# Patient Record
Sex: Female | Born: 1985 | Race: White | Hispanic: No | Marital: Married | Smoking: Never smoker
Health system: Southern US, Community
[De-identification: ages and names within clinical notes are randomized; demographics above are authoritative.]

## PROBLEM LIST (undated history)

## (undated) DIAGNOSIS — T4145XA Adverse effect of unspecified anesthetic, initial encounter: Secondary | ICD-10-CM

## (undated) DIAGNOSIS — R609 Edema, unspecified: Secondary | ICD-10-CM

## (undated) DIAGNOSIS — K219 Gastro-esophageal reflux disease without esophagitis: Secondary | ICD-10-CM

## (undated) DIAGNOSIS — T8859XA Other complications of anesthesia, initial encounter: Secondary | ICD-10-CM

## (undated) HISTORY — PX: SINUS EXPLORATION: SHX5214

## (undated) HISTORY — PX: TUBAL LIGATION: SHX77

## (undated) HISTORY — PX: APPENDECTOMY: SHX54

---

## 2004-02-22 ENCOUNTER — Emergency Department: Payer: Self-pay | Admitting: Emergency Medicine

## 2006-05-27 ENCOUNTER — Other Ambulatory Visit: Payer: Self-pay

## 2006-05-27 ENCOUNTER — Emergency Department: Payer: Self-pay | Admitting: Internal Medicine

## 2006-05-30 ENCOUNTER — Ambulatory Visit: Payer: Self-pay | Admitting: Internal Medicine

## 2006-08-10 ENCOUNTER — Encounter: Payer: Self-pay | Admitting: Maternal & Fetal Medicine

## 2006-09-07 ENCOUNTER — Encounter: Payer: Self-pay | Admitting: Maternal & Fetal Medicine

## 2006-11-07 ENCOUNTER — Other Ambulatory Visit: Payer: Self-pay

## 2006-11-07 ENCOUNTER — Emergency Department: Payer: Self-pay

## 2006-11-27 ENCOUNTER — Observation Stay: Payer: Self-pay

## 2007-01-13 ENCOUNTER — Observation Stay: Payer: Self-pay | Admitting: Obstetrics and Gynecology

## 2007-01-25 ENCOUNTER — Encounter: Payer: Self-pay | Admitting: Maternal & Fetal Medicine

## 2007-01-25 ENCOUNTER — Observation Stay: Payer: Self-pay | Admitting: Obstetrics and Gynecology

## 2007-02-03 ENCOUNTER — Observation Stay: Payer: Self-pay | Admitting: Unknown Physician Specialty

## 2007-02-04 ENCOUNTER — Inpatient Hospital Stay: Payer: Self-pay | Admitting: Unknown Physician Specialty

## 2007-04-02 ENCOUNTER — Ambulatory Visit: Payer: Self-pay | Admitting: Family Medicine

## 2007-04-26 ENCOUNTER — Ambulatory Visit: Payer: Self-pay | Admitting: Internal Medicine

## 2007-04-27 ENCOUNTER — Ambulatory Visit: Payer: Self-pay | Admitting: Family Medicine

## 2009-01-19 IMAGING — CT CT HEAD WITHOUT CONTRAST
2 series · 16 of 30 positions shown, 20 images · non-contrast
Comparison: none

REASON FOR EXAM: headache
COMMENTS:

[Series 2: without · axial · non-contrast · 0.39mm/px · z∈[+356,+476]mm · 13 of 28 slices shown, 17 images]
[im 2/28  brain]
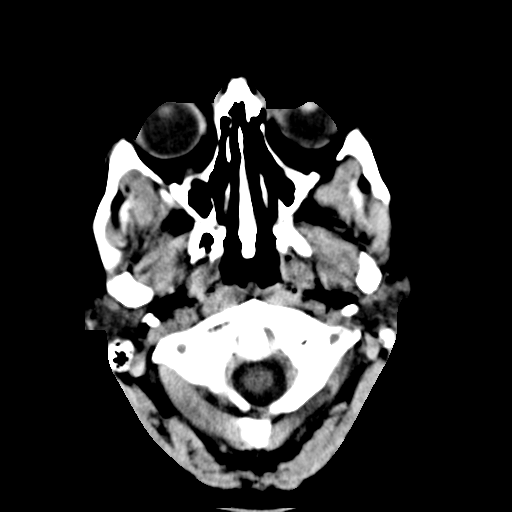
[im 2/28  bone]
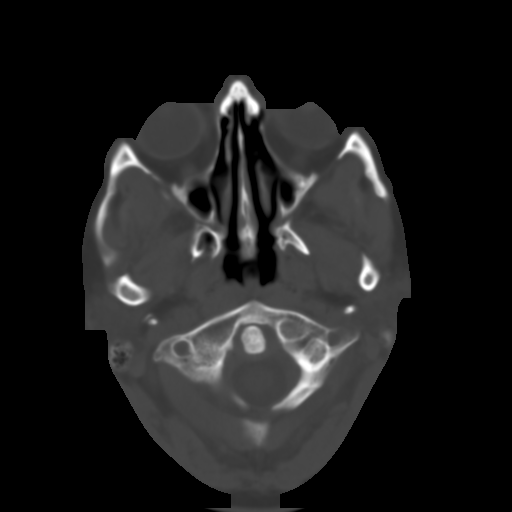
[im 4/28  brain]
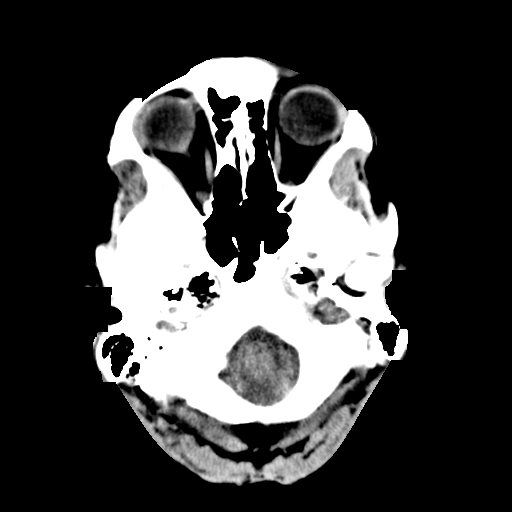
[im 6/28  brain]
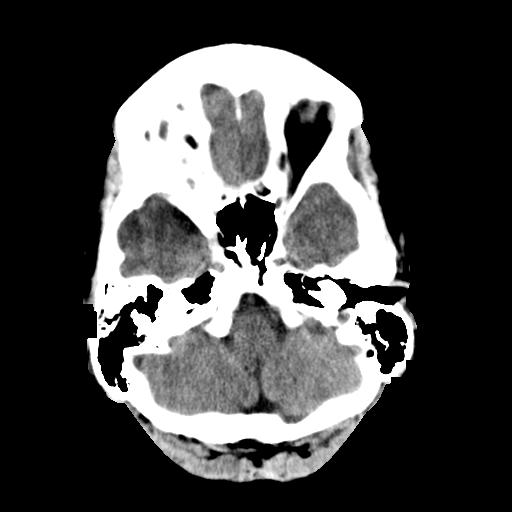
[im 8/28  brain]
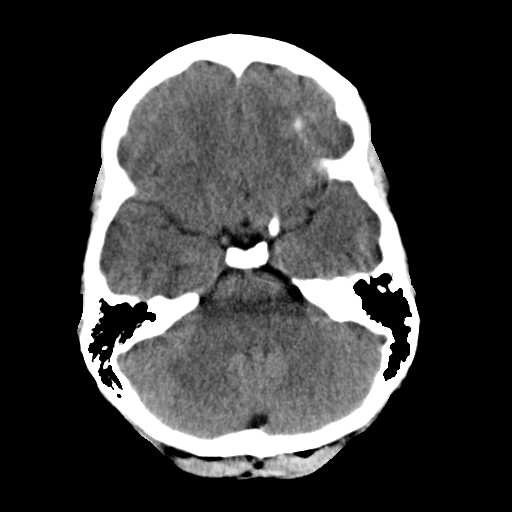
[im 10/28  brain]
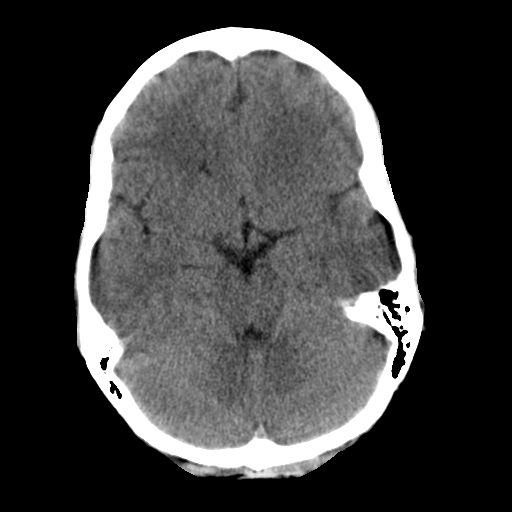
[im 10/28  bone]
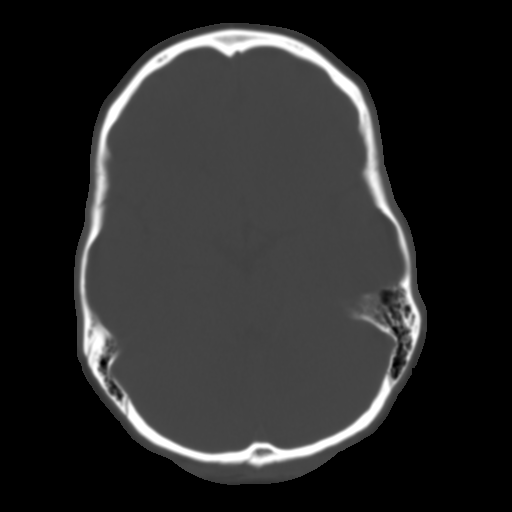
[im 12/28  brain]
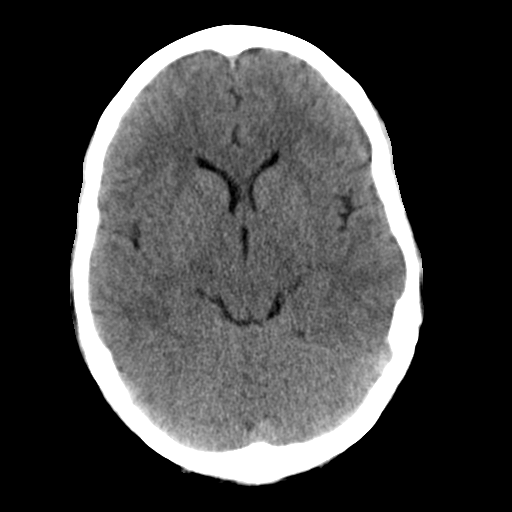
[im 14/28  brain]
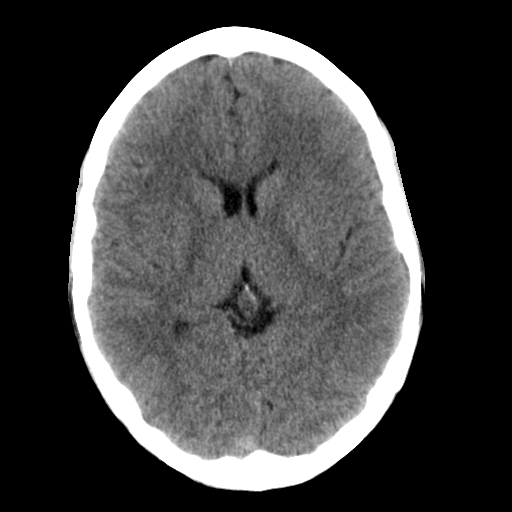
[im 16/28  brain]
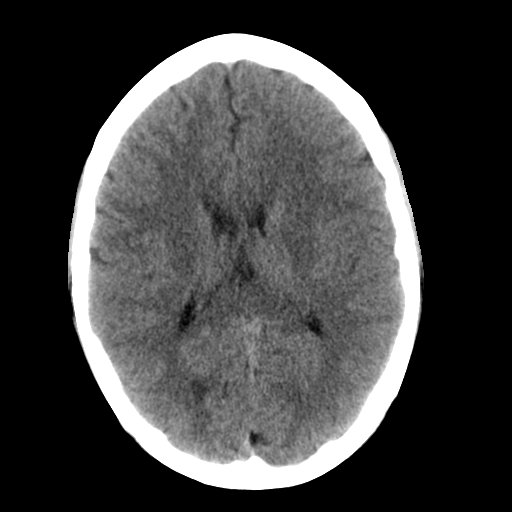
[im 18/28  brain]
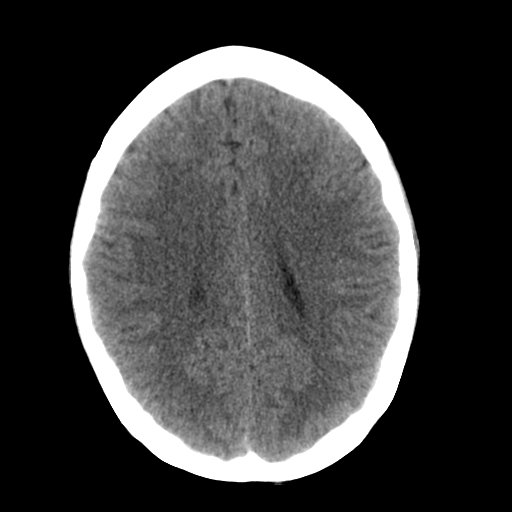
[im 18/28  bone]
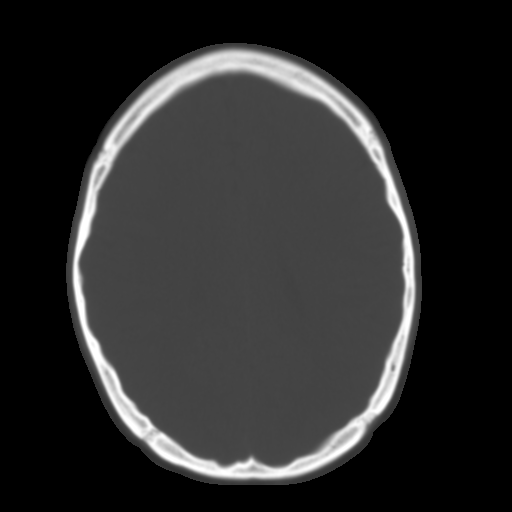
[im 20/28  brain]
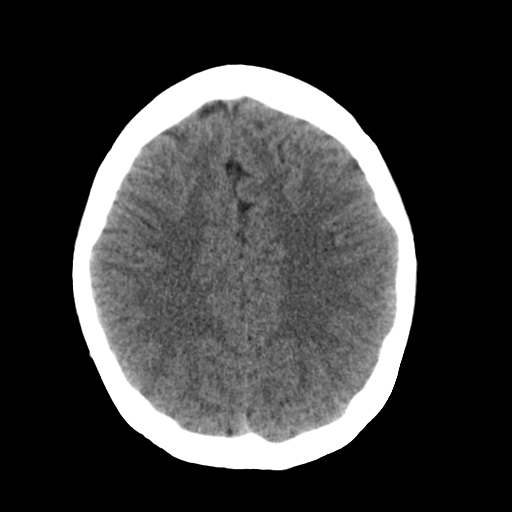
[im 22/28  brain]
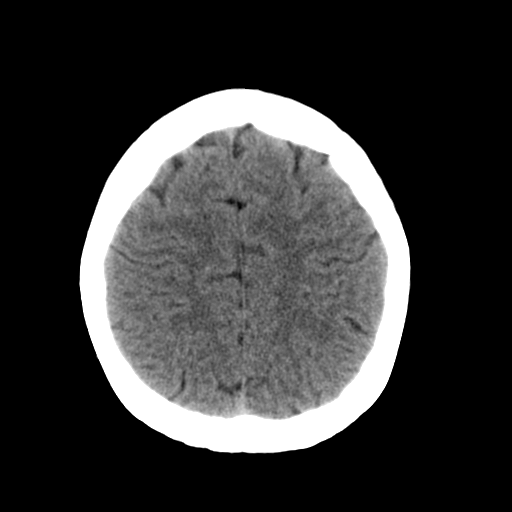
[im 24/28  brain]
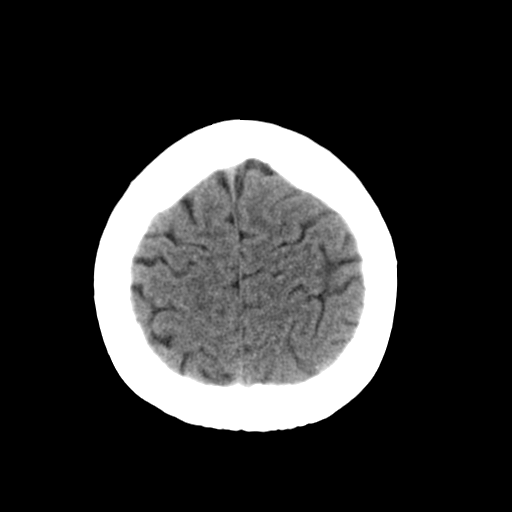
[im 26/28  brain]
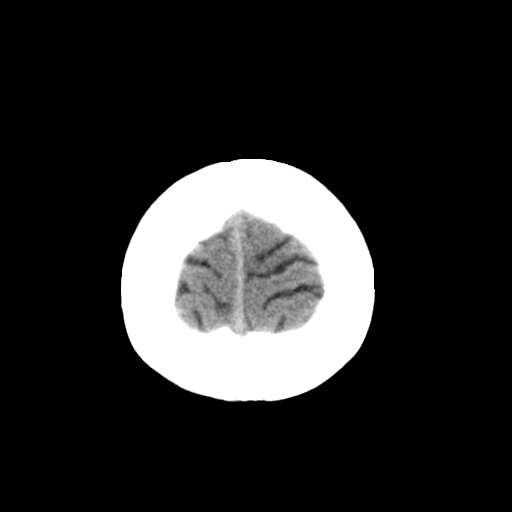
[im 26/28  bone]
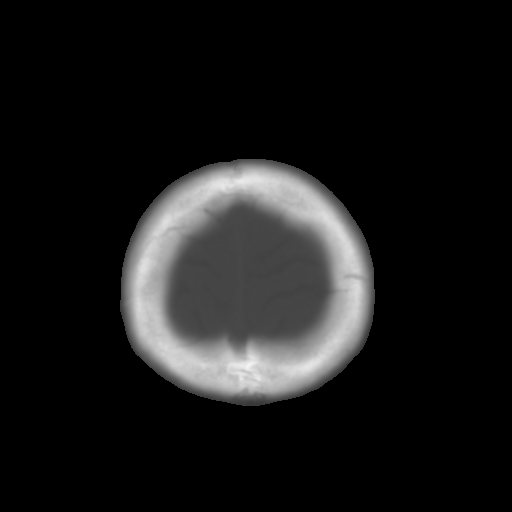

[Series 3: bone · axial · 0.39mm/px · z∈[+356,+396]mm · 3 of 28 slices shown]
[im 2/28  bone]
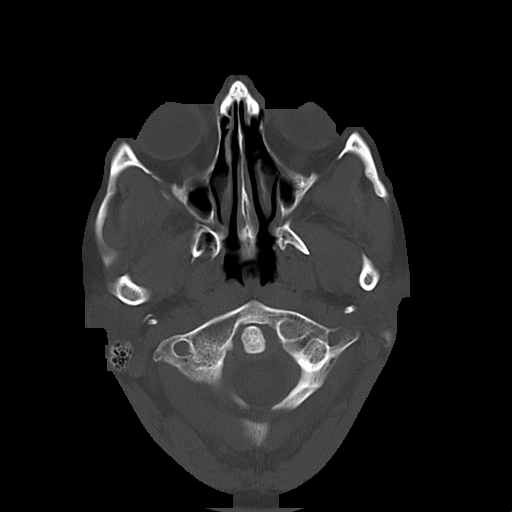
[im 6/28  bone]
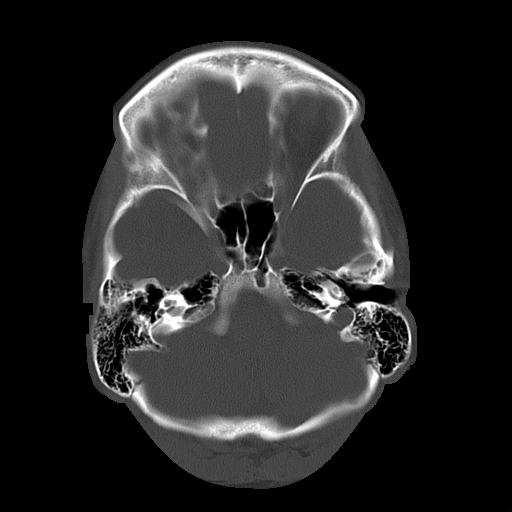
[im 10/28  bone]
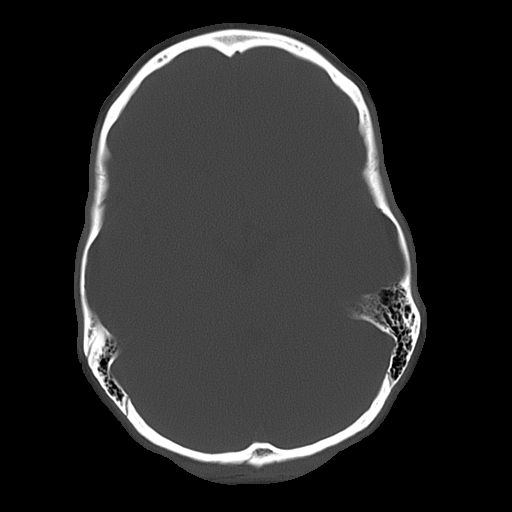

[16 of 30 positions shown; findings below may reference images not displayed]

PROCEDURE:     CT  - CT HEAD WITHOUT CONTRAST  - May 27, 2006  [DATE]

RESULT:     The ventricles are normal in size and position. There is no
intracranial hemorrhage, mass, or mass-effect. At bone window settings I do
not see evidence of an acute skull fracture. The observed portions of the
paranasal sinuses are clear.
IMPRESSION: I see no acute intracranial abnormality.

The findings were  called to the [HOSPITAL] the conclusion of
the study.

## 2009-05-13 ENCOUNTER — Other Ambulatory Visit: Payer: Self-pay | Admitting: Physician Assistant

## 2009-12-01 ENCOUNTER — Ambulatory Visit: Payer: Self-pay | Admitting: Unknown Physician Specialty

## 2010-05-06 ENCOUNTER — Emergency Department: Payer: Self-pay | Admitting: Emergency Medicine

## 2010-09-09 ENCOUNTER — Ambulatory Visit: Payer: Self-pay | Admitting: Obstetrics & Gynecology

## 2010-09-10 ENCOUNTER — Inpatient Hospital Stay: Payer: Self-pay | Admitting: Obstetrics & Gynecology

## 2011-06-22 ENCOUNTER — Ambulatory Visit: Payer: Self-pay | Admitting: Family Medicine

## 2011-08-31 ENCOUNTER — Ambulatory Visit: Payer: Self-pay | Admitting: Family Medicine

## 2011-09-02 DIAGNOSIS — R609 Edema, unspecified: Secondary | ICD-10-CM | POA: Insufficient documentation

## 2012-06-18 ENCOUNTER — Ambulatory Visit: Payer: Self-pay

## 2013-02-01 ENCOUNTER — Ambulatory Visit: Payer: Self-pay | Admitting: Physician Assistant

## 2013-02-01 LAB — RAPID STREP-A WITH REFLX: MICRO TEXT REPORT: NEGATIVE

## 2013-02-04 LAB — BETA STREP CULTURE(ARMC)

## 2013-02-24 ENCOUNTER — Ambulatory Visit: Payer: Self-pay | Admitting: Physician Assistant

## 2013-05-23 ENCOUNTER — Ambulatory Visit: Payer: Self-pay | Admitting: Family Medicine

## 2015-01-20 ENCOUNTER — Ambulatory Visit
Admission: EM | Admit: 2015-01-20 | Discharge: 2015-01-20 | Disposition: A | Discharge status: Home / Self Care | Payer: Managed Care, Other (non HMO) | Attending: Family Medicine | Admitting: Family Medicine

## 2015-01-20 ENCOUNTER — Ambulatory Visit (INDEPENDENT_AMBULATORY_CARE_PROVIDER_SITE_OTHER): Payer: Managed Care, Other (non HMO)

## 2015-01-20 DIAGNOSIS — L309 Dermatitis, unspecified: Secondary | ICD-10-CM

## 2015-01-20 DIAGNOSIS — M79641 Pain in right hand: Secondary | ICD-10-CM

## 2015-01-20 HISTORY — DX: Gilbert syndrome: E80.4

## 2015-01-20 HISTORY — DX: Edema, unspecified: R60.9

## 2015-01-20 MED ORDER — TRIAMCINOLONE 0.1 % CREAM:EUCERIN CREAM 1:1: 1.0000 "application " | TOPICAL_CREAM | Freq: Two times a day (BID) | CUTANEOUS | Status: AC | PRN

## 2015-01-20 MED ORDER — MELOXICAM 15 MG PO TABS: 15.0000 mg | ORAL_TABLET | Freq: Every day | ORAL | Status: DC | PRN

## 2015-01-20 MED ORDER — TRAMADOL HCL 50 MG PO TABS: 50.0000 mg | ORAL_TABLET | Freq: Three times a day (TID) | ORAL | Status: DC | PRN

## 2015-01-20 NOTE — ED Provider Notes (Signed)
[facility]  ____________________________________________  Time seen: Approximately 10:29 AM  I have reviewed the triage vital signs and the nursing notes.   HISTORY Chief Complaint Hand Pain  HPI Kristina Cross is a 30 y.o. female presents with a complaint of right hand pain. Patient reports right hand pain 2 days. Patient reports that Sunday she noticed some intermittent right hand pain that radiated from wrist down to middle finger. Patient reports had some tingling sensation in middle finger that comes and goes. States pain is mild at 3 out of 10 and aching. Denies constant pain. Patient reports the pain comes and goes. Patient states that pain tends to be worse with frequent movement of hands and wrists. Patient reports that she does use hands and wrist frequently. Patient reports that she is right-hand dominant. Denies known fall or injury. Patient does report that she was playing in the snow last week with children and states unsure if she had an injury then. Denies other pain to right hand or extremity. Denies pain radiation upwards. Denies numbness. Denies exacerbation by cold temperatures. Denies coloration changes. Denies loss of sensation. Denies pale coloration. Denies hands being sensitive to cold. Denies changes in exposures.   Patient also reports that during the winter both hands become really drive from washing hands frequently. 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 Patient states that hands get better with soaking in lotions but reports this is a long process every winter when her hands are the stripe. Patient reports both hands intermittently have a reddish coloration and some cracking in skin. Patient sat states that her hands feel very dry. Denies left hand pain. Denies left hand complaints otherwise. Denies changes in range of motion to bilateral hands. Denies week hand grips bilaterally. Denies fall or injury.  LMP: 2  weeks ago, denies chance of pregnancy.    Past Medical History  Diagnosis Date  . Fluid retention   . Sullivan Lone syndrome     There are no active problems to display for this patient.   Past Surgical History  Procedure Laterality Date  . Appendectomy    . Sinus exploration    . Cesarean section      x2  . Tubal ligation      Current Outpatient Rx  Name  Route  Sig  Dispense  Refill  . hydrochlorothiazide (HYDRODIURIL) 25 MG tablet   Oral   Take 25 mg by mouth daily.           Allergies Review of patient's allergies indicates no known allergies.  No family history on file.  Social History Social History  Substance Use Topics  . Smoking status: Never Smoker   . Smokeless tobacco: None  . Alcohol Use: Yes    Review of Systems Constitutional: No fever/chills Eyes: No visual changes. ENT: No sore throat. Cardiovascular: Denies chest pain. Respiratory: Denies shortness of breath. Gastrointestinal: No abdominal pain.  No nausea, no vomiting.  No diarrhea.  No constipation. Genitourinary: Negative for dysuria. Musculoskeletal: Negative for back pain. Right hand pain intermittent.  Skin: bilateral hands dryness.  Neurological: Negative for headaches, focal weakness or numbness.  10-point ROS otherwise negative.  ____________________________________________   PHYSICAL EXAM:  VITAL SIGNS: ED Triage Vitals  Enc Vitals Group     BP 01/20/15 0951 123/92 mmHg     Pulse Rate 01/20/15 0951 87     Resp 01/20/15 0951 16     Temp 01/20/15 0951 98 F (  36.7 C)     Temp Source 01/20/15 0951 Oral     SpO2 01/20/15 0951 99 %     Weight 01/20/15 0951 132 lb (59.875 kg)     Height 01/20/15 0951  (1.575 m)     Head Cir --      Peak Flow --      Pain Score 01/20/15 0955 5     Pain Loc --      Pain Edu? --      Excl. in GC? --     Constitutional: Alert and oriented. Well appearing and in no acute distress. Eyes: Conjunctivae are normal. PERRL. EOMI. Head:  Atraumatic.  Nose: No congestion/rhinnorhea.  Mouth/Throat: Mucous membranes are moist.  Oropharynx non-erythematous. Neck: No stridor.  No cervical spine tenderness to palpation. Hematological/Lymphatic/Immunilogical: No cervical lymphadenopathy. Cardiovascular: Normal rate, regular rhythm. Grossly normal heart sounds.  Good peripheral circulation. Respiratory: Normal respiratory effort.  No retractions. Lungs CTAB. Gastrointestinal: Soft and nontender.  Musculoskeletal: No lower or upper extremity tenderness nor edema.  No cervical, thoracic or lumbar TTP. Bilateral hand grips equal and strong. Bilateral distal radial pulses equal and easily palpable. Bilateral hands with cap refill less than 2 seconds to all distal fingers. Positive Tinel's and Phalen's signs and the right hand. Right hand nontender to palpation. Full ROM to right hand, no pain or difficulty with thumb touch to each finger. No sensation, motor or tendon deficit to right hand. Normal coloration and warmth. Scattered dry flaky skin noted with minimal erythema at dry skin areas to bilateral hands, No erythema otherwise. No induration, drainage or fluctuance. Neurologic:  Normal speech and language. No gross focal neurologic deficits are appreciated. No gait instability. Skin:  Skin is warm, dry and intact. No rash noted. Psychiatric: Mood and affect are normal. Speech and behavior are normal.  ____________________________________________   LABS (all labs ordered are listed, but only abnormal results are displayed)  Labs Reviewed - No data to display  RADIOLOGY  EXAM: RIGHT HAND - COMPLETE 3+ VIEW  COMPARISON: None.  FINDINGS: There is no evidence of fracture or dislocation. There is no evidence of arthropathy or other focal bone abnormality. Soft tissues are unremarkable.  IMPRESSION: Negative.   Electronically Signed By: Marnee Spring M.D. On: 01/20/2015 10:39  I, Renford Dills, personally viewed and  evaluated these images (plain radiographs) as part of my medical decision making, as well as reviewing the written report by the radiologist.  ____________________________________________   PROCEDURES  Procedure(s) performed: Right velcro cock up splint applied by RN, neurovascular intact post application.  ________________   INITIAL IMPRESSION / ASSESSMENT AND PLAN / ED COURSE  Pertinent labs & imaging results that were available during my care of the patient were reviewed by me and considered in my medical decision making (see chart for details).   Very well-appearing patient. No acute distress. Presents for the complaints of right hand pain intermittent 2 days.  Denies known fall or trauma. Neurovascular intact.Cap refill <2seconds to all distal fingers bilaterally.  Positive Tinel's and Phalen's to right hand.No bony tenderness. Bilateral hand grips equal. Normal warmth and coloration. Right-hand-dominant with frequent use of right hand. 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 Patient reports that this is chronic for her hands during the winter. Right hand discomfort suspect carpal tunnel syndrome. Counseled close monitoring. Discussed  resting, splint applied, when necessary Mobic and when necessary tramadol.   Also, gave patient a prescription for triamcinolone Eucerin compound to use for bilateral hands as needed including counseling regarding hydration and avoidance of trigger. Patient to follow-up with primary care physician this week. Information for orthopedic Dr. Joice Lofts also given to follow-up for any continued hand pain.  Discussed follow up with Primary care physician this week. Discussed follow up and return  urgent care or ER parameters including increased pain, numbness, tingling sensation, change in coloration,  difficulty in movement, no resolution or any worsening concerns. Patient verbalized understanding and agreed to plan.   ____________________________________________   FINAL CLINICAL IMPRESSION(S) / ED DIAGNOSES  Final diagnoses:  Right hand pain  Irritant dermatitis bilateral hands.       Renford Dills, NP 01/20/15 1228

## 2015-01-20 NOTE — ED Notes (Signed)
Pt c/o right hand pain with swelling since Sunday.. Pt has noted redness to BL hands, states that is normal due to having to wash her hands a lot with her job.Marland Kitchen

## 2015-01-20 NOTE — Discharge Instructions (Signed)
Use medication as prescribed. Avoid irritating triggers to hands, keep hydrated. Drink plenty of fluids. Wear splint as needed for pain, rest wrist and hand.   Follow up with your primary care physician this week as needed. Follow up with orthopedic this week as needed for continued pain.   Return to Urgent care or go to ER for increased pain, redness, swelling, numbness, loss of sensation, weakness, new or worsening concerns.   Hand Dermatitis Hand dermatitis (dyshidrotic eczema) is a skin condition in which small, itchy, raised dots or fluid-filled blisters form over the palms of the hands. Outbreaks of hand dermatitis can last 3 to 4 weeks. CAUSES  The cause of hand dermatitis is unknown. However, it occurs most often in patients with a history of allergies such as:  Hay fever.  Allergic asthma.  Allergies to latex. Chemical exposure, injuries, and environmental irritants can make hand dermatitis worse. Washing your hands too frequently can remove natural oils, which can dry out the skin and contribute to outbreaks of hand dermatitis. SYMPTOMS  The most common symptom of hand dermatitis is intense itching. Cracks or grooves (fissures) on the fingers can also develop. Affected areas can be painful, especially areas where large blisters have formed. DIAGNOSIS Your caregiver can usually tell what the problem is by doing a physical exam. PREVENTION  Avoid excessive hand washing.  Avoid the use of harsh chemicals.  Wear protective gloves when handling products that can irritate your skin. TREATMENT  Steroid creams and ointments, such as over-the-counter 1% hydrocortisone cream, can reduce inflammation and improve moisture retention. These should be applied at least 2 to 4 times per day. Your caregiver may ask you to use a stronger prescription steroid cream to help speed the healing of blistered and cracked skin. In severe cases, oral steroid medicine may be needed. If you have an  infection, antibiotics may be needed. Your caregiver may also prescribe antihistamines. These medicines help reduce itching. HOME CARE INSTRUCTIONS  Only take over-the-counter or prescription medicines as directed by your caregiver.  You may use wet or cold compresses. This can help:  Alleviate itching.  Increase the effectiveness of topical creams.  Minimize blisters. SEEK MEDICAL CARE IF:  The rash is not better after 1 week of treatment.  Signs of infection develop, such as redness, tenderness, or yellowish-white fluid (pus).  The rash is spreading.   This information is not intended to replace advice given to you by your health care provider. Make sure you discuss any questions you have with your health care provider.   Document Released: 12/20/2004 Document Revised: 03/14/2011 Document Reviewed: 07/04/2014 Elsevier Interactive Patient Education [employer]

## 2015-03-25 ENCOUNTER — Inpatient Hospital Stay: Admission: RE | Admit: 2015-03-25 | Payer: Managed Care, Other (non HMO) | Source: Ambulatory Visit

## 2015-03-25 ENCOUNTER — Encounter: Payer: Self-pay | Admitting: *Deleted

## 2015-03-25 NOTE — Patient Instructions (Signed)
  Your procedure is scheduled on: 03-27-15  Report to [facility] 2ND FLOOR To find out your arrival time please call [phone] between 1PM - 3PM on 03-26-15  Remember: Instructions that are not followed completely may result in serious medical risk, up to and including death, or upon the discretion of your surgeon and anesthesiologist your surgery may need to be rescheduled.    _X___ 1. Do not eat food or drink liquids after midnight. No gum chewing or hard candies.     _X___ 2. No Alcohol for 24 hours before or after surgery.   ____ 3. Bring all medications with you on the day of surgery if instructed.    ____ 4. Notify your doctor if there is any change in your medical condition     (cold, fever, infections).     Do not wear jewelry, make-up, hairpins, clips or nail polish.  Do not wear lotions, powders, or perfumes. You may wear deodorant.  Do not shave 48 hours prior to surgery. Men may shave face and neck.  Do not bring valuables to the hospital.    [facility] is not responsible for any belongings or valuables.               Contacts, dentures or bridgework may not be worn into surgery.  Leave your suitcase in the car. After surgery it may be brought to your room.  For patients admitted to the hospital, discharge time is determined by your treatment team.   Patients discharged the day of surgery will not be allowed to drive home.   Please read over the following fact sheets that you were given:     ____ Take these medicines the morning of surgery with A SIP OF WATER:    1. NONE  2.   3.   4.  5.  6.  ____ Fleet Enema (as directed)   ____ Use CHG Soap as directed  ____ Use inhalers on the day of surgery  ____ Stop metformin 2 days prior to surgery    ____ Take 1/2 of usual insulin dose the night before surgery and none on the morning of surgery.   ____ Stop Coumadin/Plavix/aspirin-N/A  _X___ Stop Anti-inflammatories-STOP IBUPROFEN NOW-NO  NSAIDS OR ASA PRODUCTS-TYLENOL OK TO TAKE   ____ Stop supplements until after surgery.    ____ Bring C-Pap to the hospital.

## 2015-03-27 ENCOUNTER — Ambulatory Visit
Admission: RE | Admit: 2015-03-27 | Discharge: 2015-03-27 | Disposition: A | Discharge status: Home / Self Care | Payer: Managed Care, Other (non HMO) | Source: Ambulatory Visit | Attending: Obstetrics & Gynecology | Admitting: Obstetrics & Gynecology

## 2015-03-27 ENCOUNTER — Encounter: Payer: Self-pay | Admitting: *Deleted

## 2015-03-27 ENCOUNTER — Ambulatory Visit: Payer: Managed Care, Other (non HMO) | Admitting: Anesthesiology

## 2015-03-27 ENCOUNTER — Encounter
Admission: RE | Disposition: A | Discharge status: Home / Self Care | Payer: Self-pay | Source: Ambulatory Visit | Attending: Obstetrics & Gynecology

## 2015-03-27 DIAGNOSIS — Z79899 Other long term (current) drug therapy: Secondary | ICD-10-CM | POA: Diagnosis not present

## 2015-03-27 DIAGNOSIS — Z803 Family history of malignant neoplasm of breast: Secondary | ICD-10-CM | POA: Diagnosis not present

## 2015-03-27 DIAGNOSIS — N83201 Unspecified ovarian cyst, right side: Secondary | ICD-10-CM | POA: Diagnosis not present

## 2015-03-27 DIAGNOSIS — Z842 Family history of other diseases of the genitourinary system: Secondary | ICD-10-CM | POA: Insufficient documentation

## 2015-03-27 DIAGNOSIS — N736 Female pelvic peritoneal adhesions (postinfective): Secondary | ICD-10-CM | POA: Diagnosis not present

## 2015-03-27 DIAGNOSIS — Z808 Family history of malignant neoplasm of other organs or systems: Secondary | ICD-10-CM | POA: Diagnosis not present

## 2015-03-27 DIAGNOSIS — R1031 Right lower quadrant pain: Secondary | ICD-10-CM | POA: Diagnosis not present

## 2015-03-27 DIAGNOSIS — Z91048 Other nonmedicinal substance allergy status: Secondary | ICD-10-CM | POA: Insufficient documentation

## 2015-03-27 DIAGNOSIS — Z8349 Family history of other endocrine, nutritional and metabolic diseases: Secondary | ICD-10-CM | POA: Insufficient documentation

## 2015-03-27 DIAGNOSIS — I10 Essential (primary) hypertension: Secondary | ICD-10-CM | POA: Diagnosis not present

## 2015-03-27 DIAGNOSIS — R102 Pelvic and perineal pain: Secondary | ICD-10-CM | POA: Diagnosis present

## 2015-03-27 HISTORY — DX: Gastro-esophageal reflux disease without esophagitis: K21.9

## 2015-03-27 HISTORY — DX: Other complications of anesthesia, initial encounter: T88.59XA

## 2015-03-27 HISTORY — PX: LAPAROSCOPIC UNILATERAL SALPINGECTOMY: SHX5934

## 2015-03-27 HISTORY — PX: LAPAROSCOPY: SHX197

## 2015-03-27 HISTORY — PX: LAPAROSCOPIC OVARIAN CYSTECTOMY: SHX6248

## 2015-03-27 HISTORY — DX: Adverse effect of unspecified anesthetic, initial encounter: T41.45XA

## 2015-03-27 LAB — TYPE AND SCREEN
ABO/RH(D): O POS
ANTIBODY SCREEN: NEGATIVE

## 2015-03-27 LAB — ABO/RH: ABO/RH(D): O POS

## 2015-03-27 LAB — POCT PREGNANCY, URINE: PREG TEST UR: NEGATIVE

## 2015-03-27 SURGERY — LAPAROSCOPY OPERATIVE
Anesthesia: General | Site: Abdomen | Laterality: Right | Wound class: Clean

## 2015-03-27 MED ORDER — FENTANYL CITRATE (PF) 100 MCG/2ML IJ SOLN
INTRAMUSCULAR | Status: AC
  Administered 2015-03-27: 25 ug via INTRAVENOUS
  Filled 2015-03-27: qty 2

## 2015-03-27 MED ORDER — PROPOFOL 10 MG/ML IV BOLUS
INTRAVENOUS | Status: DC | PRN
  Administered 2015-03-27: 100 mg via INTRAVENOUS

## 2015-03-27 MED ORDER — FAMOTIDINE 20 MG PO TABS
ORAL_TABLET | ORAL | Status: AC
  Administered 2015-03-27: 20 mg via ORAL
  Filled 2015-03-27: qty 1

## 2015-03-27 MED ORDER — FENTANYL CITRATE (PF) 100 MCG/2ML IJ SOLN
25.0000 ug | INTRAMUSCULAR | Status: DC | PRN
  Administered 2015-03-27 (×4): 25 ug via INTRAVENOUS

## 2015-03-27 MED ORDER — BUPIVACAINE HCL (PF) 0.5 % IJ SOLN
INTRAMUSCULAR | Status: DC | PRN
  Administered 2015-03-27: 8 mg

## 2015-03-27 MED ORDER — DEXMEDETOMIDINE HCL 200 MCG/2ML IV SOLN
INTRAVENOUS | Status: DC | PRN
  Administered 2015-03-27: 12 ug via INTRAVENOUS

## 2015-03-27 MED ORDER — ACETAMINOPHEN 325 MG PO TABS: 650.0000 mg | ORAL_TABLET | ORAL | Status: DC | PRN

## 2015-03-27 MED ORDER — GLYCOPYRROLATE 0.2 MG/ML IJ SOLN
INTRAMUSCULAR | Status: DC | PRN
  Administered 2015-03-27: 0.6 mg via INTRAVENOUS

## 2015-03-27 MED ORDER — ONDANSETRON HCL 4 MG/2ML IJ SOLN
INTRAMUSCULAR | Status: DC | PRN
  Administered 2015-03-27: 4 mg via INTRAVENOUS

## 2015-03-27 MED ORDER — ONDANSETRON HCL 4 MG/2ML IJ SOLN
4.0000 mg | Freq: Once | INTRAMUSCULAR | Status: AC | PRN
  Administered 2015-03-27: 4 mg via INTRAVENOUS

## 2015-03-27 MED ORDER — FAMOTIDINE 20 MG PO TABS
20.0000 mg | ORAL_TABLET | Freq: Once | ORAL | Status: AC
  Administered 2015-03-27: 20 mg via ORAL

## 2015-03-27 MED ORDER — FENTANYL CITRATE (PF) 100 MCG/2ML IJ SOLN
INTRAMUSCULAR | Status: DC | PRN
  Administered 2015-03-27 (×4): 50 ug via INTRAVENOUS

## 2015-03-27 MED ORDER — KETOROLAC TROMETHAMINE 30 MG/ML IJ SOLN: 30.0000 mg | Freq: Four times a day (QID) | INTRAMUSCULAR | Status: DC

## 2015-03-27 MED ORDER — BUPIVACAINE HCL (PF) 0.5 % IJ SOLN
INTRAMUSCULAR | Status: AC
Start: 2015-03-27 — End: 2015-03-27
  Filled 2015-03-27: qty 30

## 2015-03-27 MED ORDER — PHENYLEPHRINE HCL 10 MG/ML IJ SOLN
INTRAMUSCULAR | Status: DC | PRN
  Administered 2015-03-27 (×2): 50 ug via INTRAVENOUS

## 2015-03-27 MED ORDER — ONDANSETRON HCL 4 MG/2ML IJ SOLN
INTRAMUSCULAR | Status: AC
  Filled 2015-03-27: qty 2

## 2015-03-27 MED ORDER — NEOSTIGMINE METHYLSULFATE 10 MG/10ML IV SOLN
INTRAVENOUS | Status: DC | PRN
  Administered 2015-03-27: 3 mg via INTRAVENOUS

## 2015-03-27 MED ORDER — MORPHINE SULFATE (PF) 2 MG/ML IV SOLN: 1.0000 mg | INTRAVENOUS | Status: DC | PRN

## 2015-03-27 MED ORDER — ACETAMINOPHEN 650 MG RE SUPP: 650.0000 mg | RECTAL | Status: DC | PRN

## 2015-03-27 MED ORDER — LIDOCAINE HCL (CARDIAC) 20 MG/ML IV SOLN
INTRAVENOUS | Status: DC | PRN
  Administered 2015-03-27: 60 mg via INTRAVENOUS

## 2015-03-27 MED ORDER — LACTATED RINGERS IV SOLN
INTRAVENOUS | Status: DC
  Administered 2015-03-27 (×2): via INTRAVENOUS

## 2015-03-27 MED ORDER — OXYCODONE HCL 5 MG PO TABS: 5.0000 mg | ORAL_TABLET | ORAL | Status: DC | PRN

## 2015-03-27 MED ORDER — ROCURONIUM BROMIDE 100 MG/10ML IV SOLN
INTRAVENOUS | Status: DC | PRN
  Administered 2015-03-27: 5 mg via INTRAVENOUS
  Administered 2015-03-27: 30 mg via INTRAVENOUS
  Administered 2015-03-27: 10 mg via INTRAVENOUS

## 2015-03-27 MED ORDER — KETOROLAC TROMETHAMINE 30 MG/ML IJ SOLN
INTRAMUSCULAR | Status: DC | PRN
  Administered 2015-03-27: 30 mg via INTRAVENOUS

## 2015-03-27 MED ORDER — DEXAMETHASONE SODIUM PHOSPHATE 10 MG/ML IJ SOLN
INTRAMUSCULAR | Status: DC | PRN
  Administered 2015-03-27: 10 mg via INTRAVENOUS

## 2015-03-27 MED ORDER — EPHEDRINE SULFATE 50 MG/ML IJ SOLN
INTRAMUSCULAR | Status: DC | PRN
  Administered 2015-03-27: 5 mg via INTRAVENOUS

## 2015-03-27 MED ORDER — MIDAZOLAM HCL 2 MG/2ML IJ SOLN
INTRAMUSCULAR | Status: DC | PRN
  Administered 2015-03-27 (×2): 2 mg via INTRAVENOUS

## 2015-03-27 SURGICAL SUPPLY — 39 items
BLADE SURG SZ11 CARB STEEL (BLADE) ×5 IMPLANT
CANISTER SUCT 1200ML W/VALVE (MISCELLANEOUS) ×5 IMPLANT
CATH ROBINSON RED A/P 16FR (CATHETERS) ×5 IMPLANT
CHLORAPREP W/TINT 26ML (MISCELLANEOUS) ×5 IMPLANT
DRESSING TELFA 4X3 1S ST N-ADH (GAUZE/BANDAGES/DRESSINGS) IMPLANT
DRSG TEGADERM 2-3/8X2-3/4 SM (GAUZE/BANDAGES/DRESSINGS) ×15 IMPLANT
ENDOPOUCH RETRIEVER 10 (MISCELLANEOUS) IMPLANT
GAUZE SPONGE NON-WVN 2X2 STRL (MISCELLANEOUS) IMPLANT
GAUZE STRETCH 2X75IN STRL (MISCELLANEOUS) ×5 IMPLANT
GLOVE BIO SURGEON STRL SZ 6.5 (GLOVE) ×4 IMPLANT
GLOVE BIO SURGEON STRL SZ8 (GLOVE) ×5 IMPLANT
GLOVE BIO SURGEONS STRL SZ 6.5 (GLOVE) ×1
GLOVE INDICATOR 8.0 STRL GRN (GLOVE) ×5 IMPLANT
GOWN STRL REUS W/ TWL LRG LVL3 (GOWN DISPOSABLE) ×3 IMPLANT
GOWN STRL REUS W/ TWL XL LVL3 (GOWN DISPOSABLE) ×3 IMPLANT
GOWN STRL REUS W/TWL LRG LVL3 (GOWN DISPOSABLE) ×2
GOWN STRL REUS W/TWL XL LVL3 (GOWN DISPOSABLE) ×2
IRRIGATION STRYKERFLOW (MISCELLANEOUS) ×3 IMPLANT
IRRIGATOR STRYKERFLOW (MISCELLANEOUS) ×5
IV LACTATED RINGERS 1000ML (IV SOLUTION) ×5 IMPLANT
KIT PINK PAD W/HEAD ARE REST (MISCELLANEOUS) ×5
KIT PINK PAD W/HEAD ARM REST (MISCELLANEOUS) ×3 IMPLANT
LABEL OR SOLS (LABEL) ×5 IMPLANT
LIQUID BAND (GAUZE/BANDAGES/DRESSINGS) ×5 IMPLANT
NEEDLE VERESS 14GA 120MM (NEEDLE) ×5 IMPLANT
NS IRRIG 500ML POUR BTL (IV SOLUTION) ×5 IMPLANT
PACK GYN LAPAROSCOPIC (MISCELLANEOUS) ×5 IMPLANT
PAD PREP 24X41 OB/GYN DISP (PERSONAL CARE ITEMS) ×5 IMPLANT
SCISSORS METZENBAUM CVD 33 (INSTRUMENTS) ×5 IMPLANT
SHEARS HARMONIC ACE PLUS 36CM (ENDOMECHANICALS) ×5 IMPLANT
SLEEVE ENDOPATH XCEL 5M (ENDOMECHANICALS) ×5 IMPLANT
SPONGE VERSALON 2X2 STRL (MISCELLANEOUS)
STRAP SAFETY BODY (MISCELLANEOUS) ×5 IMPLANT
SUT VIC AB 2-0 UR6 27 (SUTURE) IMPLANT
SUT VIC AB 4-0 PS2 18 (SUTURE) IMPLANT
SYRINGE 10CC LL (SYRINGE) ×5 IMPLANT
TROCAR ENDO BLADELESS 11MM (ENDOMECHANICALS) ×5 IMPLANT
TROCAR XCEL NON-BLD 5MMX100MML (ENDOMECHANICALS) ×5 IMPLANT
TUBING INSUFFLATOR HI FLOW (MISCELLANEOUS) ×5 IMPLANT

## 2015-03-27 NOTE — Anesthesia Preprocedure Evaluation (Signed)
Anesthesia Evaluation  Patient identified by MRN, date of birth, ID band Patient awake    Reviewed: Allergy & Precautions, NPO status , Patient's Chart, lab work & pertinent test results  History of Anesthesia Complications (+) history of anesthetic complications  Airway Mallampati: II       Dental  (+) Teeth Intact   Pulmonary neg pulmonary ROS,           Cardiovascular negative cardio ROS       Neuro/Psych negative neurological ROS  negative psych ROS   GI/Hepatic Neg liver ROS, GERD (resolving)  ,Gilbert syndrome    Endo/Other  negative endocrine ROS  Renal/GU negative Renal ROS     Musculoskeletal   Abdominal   Peds  Hematology negative hematology ROS (+)   Anesthesia Other Findings   Reproductive/Obstetrics                             Anesthesia Physical Anesthesia Plan  ASA: II  Anesthesia Plan: General   Post-op Pain Management:    Induction: Intravenous  Airway Management Planned: Oral ETT  Additional Equipment:   Intra-op Plan:   Post-operative Plan:   Informed Consent: I have reviewed the patients History and Physical, chart, labs and discussed the procedure including the risks, benefits and alternatives for the proposed anesthesia with the patient or authorized representative who has indicated his/her understanding and acceptance.     Plan Discussed with:   Anesthesia Plan Comments:         Anesthesia Quick Evaluation

## 2015-03-27 NOTE — H&P (Signed)
History and Physical Interval Note:  03/27/2015 9:54 AM  Vladimir CroftsShannon F Lopp  has presented today for surgery, with the diagnosis of ADNEXAL MASS,RIGHT LOWER QUADRANT PAIN  The various methods of treatment have been discussed with the patient and family. After consideration of risks, benefits and other options for treatment, the patient has consented to  Procedure(s): LAPAROSCOPY OPERATIVE (N/A) EXCISION OF ADNEXAL MASS (Right) as a surgical intervention .  The patient's history has been reviewed, patient examined, no change in status, stable for surgery.  Pt has the following beta blocker history-  Not taking Beta Blocker.  I have reviewed the patient's chart and labs.  Questions were answered to the patient's satisfaction.    Letitia Libraobert Paul Chantz Montefusco

## 2015-03-27 NOTE — Op Note (Signed)
  Operative Note   03/27/2015  PRE-OP DIAGNOSIS: Right Ovarian Cyst, Right Lower Quadrant Pelvic Pain   POST-OP DIAGNOSIS: same   PROCEDURE: Procedure(s): DIAGNOSTIC LAPAROSCOPY  LAPAROSCOPIC RIGHT OVARIAN CYSTECTOMY LAPAROSCOPIC RIGHT SALPINGECTOMY   SURGEON: Annamarie MajorPaul Harris, MD, FACOG  ANESTHESIA: General   ESTIMATED BLOOD LOSS: <10 mL  COMPLICATIONS: None  DISPOSITION: PACU - hemodynamically stable.  CONDITION: stable  FINDINGS: Laparoscopic survey of the abdomen revealed a grossly normal uterus, tubes, ovaries, liver edge, gallbladder edge and appendix, Several areas of intra-abdominal adhesions were noted, including omentum to uterus and colon to side wall. Large clear fluid-filled Right Ovarian Cyst noted with distension and attenuation of right tube as well; no torsion.  PROCEDURE IN DETAIL: The patient was taken to the OR where anesthesia was administed. The patient was positioned in dorsal lithotomy in the [place] stirrups. The patient was then examined under anesthesia with the above noted findings. The patient was prepped and draped in the normal sterile fashion and foley catheter was placed. A Graves speculum was placed in the vagina and the anterior lip of the cervix was grasped with a single toothed tenaculum. A Hulka uterine manipulator was then inserted in the uterus. Uterine mobility was found to be satisfactory. The speculum was then removed.  Attention was turned to the patient's abdomen where a 5 mm skin incision was made in the umbilical fold, after injection of local anesthesia. The Veress step needle was carefully introduced into the peritoneal cavity with placement confirmed using the hanging drop technique.  Pneumoperitoneum was obtained. The 5 mm port was then placed under direct visualization with the operative laparoscope.  Trendelenburg positioning.  Additional 5mm trocar was then placed in the LLQ lateral to the inferior epigastric blood vessels under direct  visualization with the laparoscope.  Instrumentation to visualize complete pelvic anatomy performed.  A 11mm trocar was also then placed in the RLQ.  The ovarian cyst is identified and stabilized.  An incision is made with clear fluid noted.  Fluid is aspirated.  Cyst wall is dissected free from the ovarian cortex and removed. Due to the abundant size of attenuated tissue with part of the ovarian cyst and tube, this was excised as well.   Hemostasis is visualized and assured.  Contralateral ovary seen as normal.  Pelvic cavity is cleaned with any fluid aspirated.  Instruments and trocars removed, gas expelled, and skin closed with skin adhesive glue.  Instrument, needle, and sponge counts correct x2 at the conclusion of the case.  Pt goes to recovery room in stable condition.

## 2015-03-27 NOTE — Anesthesia Postprocedure Evaluation (Signed)
Anesthesia Post Note  Patient: Kristina Cross  Procedure(s) Performed: Procedure(s) (LRB): DIAGNOSTIC LAPAROSCOPY  (N/A) LAPAROSCOPIC OVARIAN CYSTECTOMY (Right) LAPAROSCOPIC UNILATERAL SALPINGECTOMY (Right)  Patient location during evaluation: PACU Anesthesia Type: General Level of consciousness: awake and alert Pain management: pain level controlled Vital Signs Assessment: post-procedure vital signs reviewed and stable Respiratory status: spontaneous breathing and respiratory function stable Cardiovascular status: stable Anesthetic complications: no    Last Vitals:  Filed Vitals:   03/27/15 1304 03/27/15 1346  BP: 102/49 93/53  Pulse: 62 63  Temp:    Resp: 14 16    Last Pain:  Filed Vitals:   03/27/15 1347  PainSc: 2                  Pegi Milazzo K

## 2015-03-27 NOTE — Discharge Instructions (Signed)
General Gynecological Post-Operative Instructions °You may expect to feel dizzy, weak, and drowsy for as long as 24 hours after receiving the medicine that made you sleep (anesthetic).  °Do not drive a car, ride a bicycle, participate in physical activities, or take public transportation until you are done taking narcotic pain medicines or as directed by your doctor.  °Do not drink alcohol or take tranquilizers.  °Do not take medicine that has not been prescribed by your doctor.  °Do not sign important papers or make important decisions while on narcotic pain medicines.  °Have a responsible person with you.  °CARE OF INCISION  °Keep incision clean and dry. °Take showers instead of baths until your doctor gives you permission to take baths.  °Avoid heavy lifting (more than 10 pounds/4.5 kilograms), pushing, or pulling.  °Avoid activities that may risk injury to your surgical site.  °No sexual intercourse or placement of anything in the vagina for 2 weeks or as instructed by your doctor. °If you have tubes coming from the wound site, check with your doctor regarding appropriate care of the tubes. °Only take prescription or over-the-counter medicines  for pain, discomfort, or fever as directed by your doctor. Do not take aspirin. It can make you bleed. Take medicines (antibiotics) that kill germs if they are prescribed for you.  °Call the office or go to the ER if:  °You feel sick to your stomach (nauseous) and you start to throw up (vomit).  °You have trouble eating or drinking.  °You have an oral temperature above 101.  °You have constipation that is not helped by adjusting diet or increasing fluid intake. Pain medicines are a common cause of constipation.  °You have any other concerns. °SEEK IMMEDIATE MEDICAL CARE IF:  °You have persistent dizziness.  °You have difficulty breathing or a congested sounding (croupy) cough.  °You have an oral temperature above 102.5, not controlled by medicine.  °There is increasing  pain or tenderness near or in the surgical site.  ° °AMBULATORY SURGERY  °DISCHARGE INSTRUCTIONS ° ° °1) The drugs that you were given will stay in your system until tomorrow so for the next 24 hours you should not: ° °A) Drive an automobile °B) Make any legal decisions °C) Drink any alcoholic beverage ° ° °2) You may resume regular meals tomorrow.  Today it is better to start with liquids and gradually work up to solid foods. ° °You may eat anything you prefer, but it is better to start with liquids, then soup and crackers, and gradually work up to solid foods. ° ° °3) Please notify your doctor immediately if you have any unusual bleeding, trouble breathing, redness and pain at the surgery site, drainage, fever, or pain not relieved by medication. ° ° ° °4) Additional Instructions: ° ° ° ° ° ° ° °Please contact your physician with any problems or Same Day Surgery at [phone], Monday through Friday 6 am to 4 pm, or [place] [facility] [place] Main number at [phone]. ° ° °

## 2015-03-27 NOTE — Anesthesia Procedure Notes (Signed)
Procedure Name: Intubation Date/Time: 03/27/2015 10:47 AM Performed by: Evelena PeatFERRERO-CONOVER, Raizy Auzenne Pre-anesthesia Checklist: Patient identified, Emergency Drugs available, Suction available and Patient being monitored Patient Re-evaluated:Patient Re-evaluated prior to inductionOxygen Delivery Method: Circle system utilized Preoxygenation: Pre-oxygenation with 100% oxygen Intubation Type: IV induction Ventilation: Mask ventilation without difficulty Laryngoscope Size: Miller and 2 Grade View: Grade I Tube type: Oral Tube size: 6.5 mm Number of attempts: 1 Placement Confirmation: ETT inserted through vocal cords under direct vision,  positive ETCO2 and breath sounds checked- equal and bilateral Secured at: 20 cm Tube secured with: Tape Dental Injury: Teeth and Oropharynx as per pre-operative assessment

## 2015-03-27 NOTE — Transfer of Care (Signed)
Immediate Anesthesia Transfer of Care Note  Patient: Kristina CroftsShannon F Keown  Procedure(s) Performed: Procedure(s): DIAGNOSTIC LAPAROSCOPY  (N/A) LAPAROSCOPIC OVARIAN CYSTECTOMY (Right) LAPAROSCOPIC UNILATERAL SALPINGECTOMY (Right)  Patient Location: PACU  Anesthesia Type:General  Level of Consciousness: awake, alert  and oriented  Airway & Oxygen Therapy: Patient Spontanous Breathing and Patient connected to nasal cannula oxygen  Post-op Assessment: Report given to RN and Post -op Vital signs reviewed and stable  Post vital signs: Reviewed and stable  Last Vitals:  Filed Vitals:   03/27/15 0951 03/27/15 1158  BP: 126/93 116/63  Pulse: 93 79  Temp: 36.8 C 36.6 C  Resp:  13    Complications: No apparent anesthesia complications

## 2015-03-30 LAB — SURGICAL PATHOLOGY

## 2016-11-01 ENCOUNTER — Encounter: Payer: Self-pay | Admitting: Obstetrics and Gynecology

## 2016-11-01 ENCOUNTER — Ambulatory Visit (INDEPENDENT_AMBULATORY_CARE_PROVIDER_SITE_OTHER): Payer: 59 | Admitting: Obstetrics and Gynecology

## 2016-11-01 VITALS — BP 118/74 | Ht 62.0 in | Wt 128.0 lb

## 2016-11-01 DIAGNOSIS — Z1331 Encounter for screening for depression: Secondary | ICD-10-CM | POA: Diagnosis not present

## 2016-11-01 DIAGNOSIS — N6311 Unspecified lump in the right breast, upper outer quadrant: Secondary | ICD-10-CM

## 2016-11-01 DIAGNOSIS — Z1339 Encounter for screening examination for other mental health and behavioral disorders: Secondary | ICD-10-CM

## 2016-11-01 DIAGNOSIS — Z01419 Encounter for gynecological examination (general) (routine) without abnormal findings: Secondary | ICD-10-CM | POA: Diagnosis not present

## 2016-11-01 NOTE — Progress Notes (Signed)
Gynecology Annual Exam   PCP: Titus Mould, NP  Chief Complaint  Patient presents with  . Annual Exam    History of Present Illness:  Ms. Kristina Cross is a 31 y.o. [id number] who LMP was No LMP recorded., presents today for her annual examination.  Her menses are regular every 28-30 days, lasting 5 day(s).  Dysmenorrhea severe, occurring first 1-2 days of flow. She does not have intermenstrual bleeding.  She is single partner, contraception - tubal ligation.  Last Pap: 06/04/2015  Results were: no abnormalities /neg HPV DNA negative Hx of STDs: none  Last mammogram: n/a There is no FH of breast cancer. There is no FH of ovarian cancer. The patient does not do self-breast exams.  Tobacco use: The patient denies current or previous tobacco use. Alcohol use: social drinker Exercise: very active  The patient wears seatbelts: yes.      Past Medical History:  Diagnosis Date  . Complication of anesthesia    PT STATES ANESTHESIA ALWAYS ASKS HER IF SHE SMOKES WHEN COMING OUT OF ANESTHESIA-PT UNSURE IF SHE IS HAVING TROUBLE BREATHING OR COUGHING??  . Fluid retention   . GERD (gastroesophageal reflux disease)    H/O  . Sullivan Lone syndrome    Past Surgical History:  Procedure Laterality Date  . APPENDECTOMY    . CESAREAN SECTION     x2  . LAPAROSCOPIC OVARIAN CYSTECTOMY Right 03/27/2015   Procedure: LAPAROSCOPIC OVARIAN CYSTECTOMY;  Surgeon: Nadara Mustard, MD;  Location: [facility];  Service: Gynecology;  Laterality: Right;  . LAPAROSCOPIC UNILATERAL SALPINGECTOMY Right 03/27/2015   Procedure: LAPAROSCOPIC UNILATERAL SALPINGECTOMY;  Surgeon: Nadara Mustard, MD;  Location: [facility];  Service: Gynecology;  Laterality: Right;  . LAPAROSCOPY N/A 03/27/2015   Procedure: DIAGNOSTIC LAPAROSCOPY ;  Surgeon: Nadara Mustard, MD;  Location: [facility];  Service: Gynecology;  Laterality: N/A;  . SINUS EXPLORATION    . TUBAL LIGATION      Home Medications   Medication Sig Start  Date End Date Taking? Authorizing Provider  hydrochlorothiazide (HYDRODIURIL) 25 MG tablet Take 25 mg by mouth daily.   Yes [provider]  ibuprofen (ADVIL,MOTRIN) 200 MG tablet Take 200 mg by mouth every 6 (six) hours as needed.    [provider]   Allergies  Allergen Reactions  . Tape Rash    SURGICAL TAPE FROM C-SECTION   Gynecologic History: No LMP recorded.  Obstetric History: [id number]  Social History   Social History  . Marital status: Married    Spouse name: N/A  . Number of children: N/A  . Years of education: N/A   Occupational History  . Not on file.   Social History Main Topics  . Smoking status: Never Smoker  . Smokeless tobacco: Not on file  . Alcohol use Yes     Comment: OCC  . Drug use: No  . Sexual activity: Yes    Birth control/ protection: Surgical   Other Topics Concern  . Not on file   Social History Narrative  . No narrative on file    Family History  Problem Relation Age of Onset  . Heart disease Father   . Heart disease Paternal Grandmother    Review of Systems  Constitutional: Negative.   HENT: Negative.   Eyes: Negative.   Respiratory: Negative.   Cardiovascular: Negative.   Gastrointestinal: Negative.   Genitourinary: Negative.   Musculoskeletal: Negative.   Skin: Negative.   Neurological: Negative.   Psychiatric/Behavioral: Negative.  Physical Exam BP 118/74   Ht 5\' 2"  (1.575 m)   Wt 128 lb (58.1 kg)   BMI 23.41 kg/m    Physical Exam  Constitutional: She is oriented to person, place, and time. She appears well-developed and well-nourished. No distress.  Genitourinary: Vagina normal and uterus normal. Pelvic exam was performed with patient supine. There is no rash, tenderness or lesion on the right labia. There is no rash, tenderness or lesion on the left labia. Vagina exhibits no lesion. No erythema, tenderness or bleeding in the vagina. No signs of injury around the vagina. No vaginal discharge  found. Right adnexum does not display mass, does not display tenderness and does not display fullness. Left adnexum does not display mass, does not display tenderness and does not display fullness. Cervix does not exhibit motion tenderness, lesion or polyp.   Uterus is mobile and anteverted. Uterus is not enlarged, tender, exhibiting a mass or irregular (is regular).  HENT:  Head: Normocephalic and atraumatic.  Eyes: EOM are normal. No scleral icterus.  Neck: Normal range of motion. Neck supple. No thyromegaly present.  Cardiovascular: Normal rate and regular rhythm.  Exam reveals no gallop and no friction rub.   No murmur heard. Pulmonary/Chest: Effort normal and breath sounds normal. No respiratory distress. She has no wheezes. She has no rales. Right breast exhibits mass. Right breast exhibits no inverted nipple, no nipple discharge, no skin change and no tenderness. Left breast exhibits no inverted nipple, no mass, no nipple discharge, no skin change and no tenderness.    Abdominal: Soft. Bowel sounds are normal. She exhibits no distension and no mass. There is no tenderness. There is no rebound and no guarding.  Musculoskeletal: Normal range of motion. She exhibits no edema.  Lymphadenopathy:    She has no cervical adenopathy.  Neurological: She is alert and oriented to person, place, and time. No cranial nerve deficit.  Skin: Skin is warm and dry. No erythema.  Psychiatric: She has a normal mood and affect. Her behavior is normal. Judgment normal.   Female chaperone present for pelvic and breast  portions of the physical exam  Results: AUDIT Questionnaire (screen for alcoholism): 3 PHQ-9: 1  Assessment: 31 y.o. [id number] female here for routine annual gynecologic examination.  Plan: Problem List Items Addressed This Visit    Mass of upper outer quadrant of right breast    Ultrasound of lesion and diagnostic mammogram per radiology protocol.   Discussed with patient.        Relevant Orders   MM DIAG BREAST TOMO BILATERAL   US BREAST LTD UNI RIGHT INC AXILLA    Other Visit Diagnoses    Women's annual routine gynecological examination    -  Primary   Screening for depression       Screening for alcohol problem          Screening: -- Blood pressure screen normal -- Colonoscopy - not due -- Mammogram - not due -- Weight screening: normal -- Depression screening negative (PHQ-9) -- Nutrition: normal -- cholesterol screening: not due for screening -- osteoporosis screening: not due -- tobacco screening: not using -- alcohol screening: AUDIT questionnaire indicates low-risk usage. -- family history of breast cancer screening: done. not at high risk. -- no evidence of domestic violence or intimate partner violence. -- STD screening: gonorrhea/chlamydia NAAT not collected per patient request. -- pap smear not collected per ASCCP guidelines -- flu vaccine states she will get at PCP. Offered here today. Patient declined  in favor of getting at PCP -- HPV vaccination series: not eligilbe  Thomasene Mohair, MD 11/02/2016 11:12 AM

## 2016-11-02 ENCOUNTER — Encounter: Payer: Self-pay | Admitting: Obstetrics and Gynecology

## 2016-11-02 DIAGNOSIS — N6311 Unspecified lump in the right breast, upper outer quadrant: Secondary | ICD-10-CM | POA: Insufficient documentation

## 2016-11-02 NOTE — Assessment & Plan Note (Signed)
Ultrasound of lesion and diagnostic mammogram per radiology protocol.   Discussed with patient.

## 2016-11-07 ENCOUNTER — Ambulatory Visit
Admission: RE | Admit: 2016-11-07 | Discharge: 2016-11-07 | Disposition: A | Discharge status: Home / Self Care | Payer: 59 | Source: Ambulatory Visit | Attending: Obstetrics and Gynecology | Admitting: Obstetrics and Gynecology

## 2016-11-07 DIAGNOSIS — N6311 Unspecified lump in the right breast, upper outer quadrant: Secondary | ICD-10-CM

## 2017-02-03 ENCOUNTER — Ambulatory Visit: Payer: 59 | Admitting: Advanced Practice Midwife

## 2017-02-03 ENCOUNTER — Encounter: Payer: Self-pay | Admitting: Advanced Practice Midwife

## 2017-02-03 VITALS — BP 100/60 | Ht 62.0 in | Wt 127.0 lb

## 2017-02-03 DIAGNOSIS — R102 Pelvic and perineal pain: Secondary | ICD-10-CM | POA: Diagnosis not present

## 2017-02-03 NOTE — Progress Notes (Signed)
S: The patient is here today for evaluation of her right ovary. She started having pain in that area about 2 weeks ago and she also had blood tinged mucous discharge. She denies bleeding otherwise. The pain had been getting steadily worse until yesterday when it severe. Today she is feeling much better. She has a history of excision of adnexal mass on right side in March of 2017. She is getting ready to go out of the country in 2 weeks and wants to make sure that she does not have another ovarian cyst.   O: Vital Signs: BP 100/60   Ht 5\' 2"  (1.575 m)   Wt 127 lb (57.6 kg)   LMP 01/22/2017   BMI 23.23 kg/m  Constitutional: Well nourished, well developed female in no acute distress.  HEENT: normal Skin: Warm and dry.  Cardiovascular: Regular rate and rhythm.   Respiratory: Clear to auscultation bilateral. Normal respiratory effort Abdomen: soft, nontender, nondistended, no abnormal masses, no epigastric pain Psych: Alert and Oriented x3. No memory deficits. Normal mood and affect.  MS: normal gait, normal bilateral lower extremity ROM/strength/stability.  Pelvic exam:  is not limited by body habitus EGBUS: within normal limits Vagina: within normal limits and with normal mucosa, no discharge Cervix: no cervical motion tenderness Adnexa: Bilateral non-tender to palpation, no enlargement or masses palpated Uterus: mobile, non tender to palpation  A: 32 yo female presenting for evaluation of ovarian pain  P: Return to clinic for worsening symptoms Possible imaging if pain worsens  Tresea MallJane Oberon Hehir, CNM

## 2017-09-25 ENCOUNTER — Ambulatory Visit
Admission: RE | Admit: 2017-09-25 | Discharge: 2017-09-25 | Disposition: A | Discharge status: Home / Self Care | Payer: 59 | Source: Ambulatory Visit | Attending: Family Medicine | Admitting: Family Medicine

## 2017-09-25 ENCOUNTER — Other Ambulatory Visit: Payer: Self-pay | Admitting: Family Medicine

## 2017-09-25 DIAGNOSIS — R05 Cough: Secondary | ICD-10-CM | POA: Insufficient documentation

## 2017-09-25 DIAGNOSIS — R059 Cough, unspecified: Secondary | ICD-10-CM

## 2019-05-16 ENCOUNTER — Ambulatory Visit: Payer: 59 | Admitting: Obstetrics and Gynecology

## 2019-06-12 ENCOUNTER — Encounter: Payer: Self-pay | Admitting: Obstetrics and Gynecology

## 2019-06-12 ENCOUNTER — Other Ambulatory Visit (HOSPITAL_COMMUNITY)
Admission: RE | Admit: 2019-06-12 | Discharge: 2019-06-12 | Disposition: A | Discharge status: Home / Self Care | Payer: No Typology Code available for payment source | Source: Ambulatory Visit | Attending: Obstetrics and Gynecology | Admitting: Obstetrics and Gynecology

## 2019-06-12 ENCOUNTER — Other Ambulatory Visit: Payer: Self-pay

## 2019-06-12 ENCOUNTER — Ambulatory Visit (INDEPENDENT_AMBULATORY_CARE_PROVIDER_SITE_OTHER): Payer: No Typology Code available for payment source | Admitting: Obstetrics and Gynecology

## 2019-06-12 VITALS — BP 118/76 | HR 76 | Ht 62.0 in | Wt 136.0 lb

## 2019-06-12 DIAGNOSIS — Z01419 Encounter for gynecological examination (general) (routine) without abnormal findings: Secondary | ICD-10-CM | POA: Diagnosis not present

## 2019-06-12 DIAGNOSIS — Z1339 Encounter for screening examination for other mental health and behavioral disorders: Secondary | ICD-10-CM | POA: Diagnosis not present

## 2019-06-12 DIAGNOSIS — Z124 Encounter for screening for malignant neoplasm of cervix: Secondary | ICD-10-CM | POA: Insufficient documentation

## 2019-06-12 DIAGNOSIS — Z1331 Encounter for screening for depression: Secondary | ICD-10-CM | POA: Diagnosis not present

## 2019-06-12 DIAGNOSIS — N921 Excessive and frequent menstruation with irregular cycle: Secondary | ICD-10-CM

## 2019-06-12 NOTE — Progress Notes (Signed)
Gynecology Annual Exam  PCP: Ricardo Jericho, NP  Chief Complaint  Patient presents with   Gynecologic Exam    frequent cycles w/ spotting in between   Abdominal Pain   History of Present Illness:  Ms. Kristina Cross is a 34 y.o. [id number] who LMP was Patient's last menstrual period was 06/08/2019 (exact date)., presents today for her annual examination.  Her menses were monthly and for the past three months she has had two per month.  She has been tracking her menses with an app on her phone.  Her last period started 4 days ago that ended yesterday.  Her menses in May 5/11-5/14 and 5/23-5/25 (a lot of spotting), started again on 6/5 (heavy on 6/7) ended yesterday.  She notes her periods are painful.  The first day of her periods are painful, she is in bed with her pain.  She has to take a lot of ibuprofen at first. Usually the first two days are heavy with clots.  She denies postcoital spotting/bleeding and pain.   A few times per month she will have shooting pains in her lower abdomen, sometimes across the entire lower abdomen, sometimes left, and sometimes right.  She can think of nothing that causes the pain.  She has no associated symptoms. She states that she eventually will have vaginal discharge that is brown and mucoid.     She is sexually active, BTL for contraception.  Last Pap: 2017  Results were: no abnormalities /neg HPV DNA negative Hx of STDs: none  There is no FH of breast cancer. There is no FH of ovarian cancer. The patient does do self-breast exams.  Tobacco use: The patient denies current or previous tobacco use. Alcohol use: social drinker Exercise: works out five days per week  The patient wears seatbelts: yes.   The patient reports that domestic violence in her life is absent.   Past Medical History:  Diagnosis Date   Complication of anesthesia    PT STATES ANESTHESIA ALWAYS ASKS HER IF SHE SMOKES WHEN COMING OUT OF ANESTHESIA-PT UNSURE IF SHE IS  HAVING TROUBLE BREATHING OR COUGHING??   Fluid retention    GERD (gastroesophageal reflux disease)    H/O   Rosanna Randy syndrome     Past Surgical History:  Procedure Laterality Date   APPENDECTOMY     CESAREAN SECTION     x2   LAPAROSCOPIC OVARIAN CYSTECTOMY Right 03/27/2015   Procedure: LAPAROSCOPIC OVARIAN CYSTECTOMY;  Surgeon: Gae Dry, MD;  Location: [facility];  Service: Gynecology;  Laterality: Right;   LAPAROSCOPIC UNILATERAL SALPINGECTOMY Right 03/27/2015   Procedure: LAPAROSCOPIC UNILATERAL SALPINGECTOMY;  Surgeon: Gae Dry, MD;  Location: [facility];  Service: Gynecology;  Laterality: Right;   LAPAROSCOPY N/A 03/27/2015   Procedure: DIAGNOSTIC LAPAROSCOPY ;  Surgeon: Gae Dry, MD;  Location: [facility];  Service: Gynecology;  Laterality: N/A;   SINUS EXPLORATION     TUBAL LIGATION      Prior to Admission medications   Medication Sig Start Date End Date Taking? Authorizing Provider  hydrochlorothiazide (HYDRODIURIL) 25 MG tablet Take 25 mg by mouth daily.   Yes [provider]    Allergies  Allergen Reactions   Tape Rash    SURGICAL TAPE FROM C-SECTION    Obstetric History: [id number]  Social History   Socioeconomic History   Marital status: Married    Spouse name: Not on file   Number of children: Not on file   Years of education: Not  on file   Highest education level: Not on file  Occupational History   Not on file  Tobacco Use   Smoking status: Never Smoker   Smokeless tobacco: Never Used  Vaping Use   Vaping Use: Never used  Substance and Sexual Activity   Alcohol use: Yes    Comment: OCC   Drug use: No   Sexual activity: Yes    Birth control/protection: Surgical    Comment: Tubal Ligation  Other Topics Concern   Not on file  Social History Narrative   Not on file   Social Determinants of Health   Financial Resource Strain:    Difficulty of Paying Living Expenses:   Food Insecurity:    Worried  About Programme researcher, broadcasting/film/video in the Last Year:    Barista in the Last Year:   Transportation Needs:    Freight forwarder (Medical):    Lack of Transportation (Non-Medical):   Physical Activity:    Days of Exercise per Week:    Minutes of Exercise per Session:   Stress:    Feeling of Stress :   Social Connections:    Frequency of Communication with Friends and Family:    Frequency of Social Gatherings with Friends and Family:    Attends Religious Services:    Active Member of Clubs or Organizations:    Attends Engineer, structural:    Marital Status:   Intimate Partner Violence:    Fear of Current or Ex-Partner:    Emotionally Abused:    Physically Abused:    Sexually Abused:     Family History  Problem Relation Age of Onset   Heart disease Father    Heart disease Paternal Grandmother     Review of Systems  Constitutional: Negative.   HENT: Negative.   Eyes: Negative.   Respiratory: Negative.   Cardiovascular: Negative.   Gastrointestinal: Negative.   Genitourinary: Negative.   Musculoskeletal: Negative.   Skin: Negative.   Neurological: Negative.   Psychiatric/Behavioral: Negative.      Physical Exam BP 118/76    Pulse 76    Ht 5\' 2"  (1.575 m)    Wt 136 lb (61.7 kg)    LMP 06/08/2019 (Exact Date)    BMI 24.87 kg/m    Physical Exam Constitutional:      General: She is not in acute distress.    Appearance: Normal appearance. She is well-developed.  Genitourinary:     Pelvic exam was performed with patient in the lithotomy position.     Vulva, urethra, bladder and uterus normal.     No inguinal adenopathy present in the right or left side.    No signs of injury in the vagina.     No vaginal discharge, erythema, tenderness or bleeding.     Vaginal exam comments: Scant dark brown blood in vaginal vault.     No cervical motion tenderness, discharge, lesion or polyp.     Uterus is mobile.     Uterus is not enlarged or tender.      No uterine mass detected.    Uterus is anteverted.     No right or left adnexal mass present.     Right adnexa not tender or full.     Left adnexa not tender or full.  HENT:     Head: Normocephalic and atraumatic.  Eyes:     General: No scleral icterus.    Conjunctiva/sclera: Conjunctivae normal.  Neck:  Thyroid: No thyromegaly.  Cardiovascular:     Rate and Rhythm: Normal rate and regular rhythm.     Heart sounds: No murmur. No friction rub. No gallop.   Pulmonary:     Effort: Pulmonary effort is normal. No respiratory distress.     Breath sounds: Normal breath sounds. No wheezing or rales.  Chest:     Breasts:        Right: No inverted nipple, mass, nipple discharge, skin change or tenderness.        Left: No inverted nipple, mass, nipple discharge, skin change or tenderness.  Abdominal:     General: Bowel sounds are normal. There is no distension.     Palpations: Abdomen is soft. There is no mass.     Tenderness: There is no abdominal tenderness. There is no guarding or rebound.  Musculoskeletal:        General: No swelling or tenderness. Normal range of motion.     Cervical back: Normal range of motion and neck supple.  Lymphadenopathy:     Cervical: No cervical adenopathy.     Lower Body: No right inguinal adenopathy. No left inguinal adenopathy.  Neurological:     General: No focal deficit present.     Mental Status: She is alert and oriented to person, place, and time.     Cranial Nerves: No cranial nerve deficit.  Skin:    General: Skin is warm and dry.     Findings: No erythema or rash.  Psychiatric:        Mood and Affect: Mood normal.        Behavior: Behavior normal.        Judgment: Judgment normal.     Female chaperone present for pelvic and breast  portions of the physical exam  Results: AUDIT Questionnaire (screen for alcoholism): 2 PHQ-9: 0   Assessment: 34 y.o. G105P1102 female here for routine annual gynecologic  examination  Plan: Problem List Items Addressed This Visit    None    Visit Diagnoses    Women's annual routine gynecological examination    -  Primary   Relevant Orders   Cytology - PAP   Screening for depression       Screening for alcoholism       Pap smear for cervical cancer screening       Relevant Orders   Cytology - PAP   Menorrhagia with irregular cycle       Relevant Orders   US PELVIS TRANSVAGINAL NON-OB (TV ONLY)      Screening: -- Blood pressure screen normal -- Weight screening: normal -- Depression screening negative (PHQ-9) -- Nutrition: normal -- cholesterol screening: not due for screening -- osteoporosis screening: not due -- tobacco screening: not using -- alcohol screening: AUDIT questionnaire indicates low-risk usage. -- family history of breast cancer screening: done. not at high risk. -- no evidence of domestic violence or intimate partner violence. -- STD screening: gonorrhea/chlamydia NAAT not collected per patient request. -- pap smear collected per ASCCP guidelines  Menorrhagia with irregular cycle: We will conduct a basic work up examining using the PALM-COIEN classification system.  In the meantime the patient opts to trial no medication while we await results of her ultrasound and labs.  Bleeding precautions reviewed.    Thomasene Mohair, MD 06/12/2019 2:51 PM

## 2019-06-13 ENCOUNTER — Encounter: Payer: Self-pay | Admitting: Obstetrics and Gynecology

## 2019-06-17 LAB — CYTOLOGY - PAP
Comment: NEGATIVE
Diagnosis: NEGATIVE
High risk HPV: NEGATIVE

## 2019-06-18 ENCOUNTER — Encounter: Payer: Self-pay | Admitting: Obstetrics and Gynecology

## 2019-06-24 ENCOUNTER — Ambulatory Visit (INDEPENDENT_AMBULATORY_CARE_PROVIDER_SITE_OTHER): Payer: No Typology Code available for payment source

## 2019-06-24 ENCOUNTER — Encounter: Payer: Self-pay | Admitting: Obstetrics and Gynecology

## 2019-06-24 ENCOUNTER — Ambulatory Visit (INDEPENDENT_AMBULATORY_CARE_PROVIDER_SITE_OTHER): Payer: No Typology Code available for payment source | Admitting: Obstetrics and Gynecology

## 2019-06-24 ENCOUNTER — Other Ambulatory Visit: Payer: Self-pay

## 2019-06-24 DIAGNOSIS — N921 Excessive and frequent menstruation with irregular cycle: Secondary | ICD-10-CM

## 2019-06-24 DIAGNOSIS — R9389 Abnormal findings on diagnostic imaging of other specified body structures: Secondary | ICD-10-CM | POA: Diagnosis not present

## 2019-06-24 NOTE — Progress Notes (Signed)
    Virtual Visit via Telephone Note  I connected with Kristina Cross on 06/24/19 at  4:10 PM EDT by telephone and verified that I am speaking with the correct person using two identifiers.   I discussed the limitations, risks, security and privacy concerns of performing an evaluation and management service by telephone and the availability of in person appointments. I also discussed with the patient that there may be a patient responsible charge related to this service. The patient expressed understanding and agreed to proceed.  She was located at home. I was located in my office. Included in the phone conversation were: Vladimir Crofts and Thomasene Mohair, MD   History of Present Illness: Patient is a 34 y.o. female who presents today for ultrasound evaluation of abnormal uterine bleeding.  Ultrasound demonstrates the following findings Adnexa: no masses seen  Uterus: anteverted with endometrial stripe  17.1 mm Additional: no masses noted.     Observations/Objective: Physical Exam could not be performed. Because of the COVID-19 outbreak this visit was performed over the phone and not in person.   Imaging Results US PELVIS TRANSVAGINAL NON-OB (TV ONLY)  Result Date: 06/24/2019 Patient Name: Kristina Cross DOB: 1985-02-01 MRN: [id number] ULTRASOUND REPORT Location: [facility] Date of Service: 06/24/2019 Indications:Abnormal Uterine Bleeding Findings: The uterus is anteverted and measures 9.9 x 5.2 x 4.4 cm. Echo texture is homogenous without evidence of focal masses. The Endometrium measures 17.3 mm. Right Ovary measures 2.9 x 2.4 x 1.5 cm. It is normal in appearance. Left Ovary measures 3.5 x 3.1 x 2.0 cm. It is normal in appearance. Survey of the adnexa demonstrates no adnexal masses. There is no free fluid in the cul de sac. Impression: 1. Normal pelvic ultrasound. Recommendations: 1.Clinical correlation with the patient's History and Physical Exam. 2. Consider saline  infusion sonohysterogram, if clinical suspicion for endometrial polyp, given endometrial stripe measurement. Deanna Artis, RT The ultrasound images and findings were reviewed by me and I agree with the above report. Thomasene Mohair, MD, [facility], [facility] 06/24/2019 5:43 PM       Assessment and Plan:   ICD-10-CM   1. Menorrhagia with irregular cycle  N92.1 [place] Sonohysterogram  2. Thickened endometrium  R93.89 [place] Sonohysterogram     Follow Up Instructions: Plan for SIS to better characterize the lining of her uterus.  Discussed multiple investigative vs treatment modalities.  Will start with SIS, as noted.    I discussed the assessment and treatment plan with the patient. The patient was provided an opportunity to ask questions and all were answered. The patient agreed with the plan and demonstrated an understanding of the instructions.   The patient was advised to call back or seek an in-person evaluation if the symptoms worsen or if the condition fails to improve as anticipated.  I provided 21 minutes of non-face-to-face time during this encounter.   Thomasene Mohair, MD  06/24/2019 6:03 PM

## 2019-06-25 ENCOUNTER — Telehealth: Payer: Self-pay | Admitting: Obstetrics and Gynecology

## 2019-06-25 NOTE — Telephone Encounter (Signed)
Called pt and scheduled SIS for 7/12 @ 2:30

## 2019-07-15 ENCOUNTER — Ambulatory Visit (INDEPENDENT_AMBULATORY_CARE_PROVIDER_SITE_OTHER): Payer: No Typology Code available for payment source

## 2019-07-15 ENCOUNTER — Other Ambulatory Visit: Payer: Self-pay

## 2019-07-15 ENCOUNTER — Other Ambulatory Visit: Payer: Self-pay | Admitting: Obstetrics and Gynecology

## 2019-07-15 DIAGNOSIS — N921 Excessive and frequent menstruation with irregular cycle: Secondary | ICD-10-CM | POA: Diagnosis not present

## 2019-07-15 DIAGNOSIS — R9389 Abnormal findings on diagnostic imaging of other specified body structures: Secondary | ICD-10-CM | POA: Diagnosis not present

## 2019-07-15 NOTE — Progress Notes (Signed)
Gynecology Ultrasound Follow Up  Chief Complaint: Follow up after attempted SIS ultrasound for menorrhagia with irregular cycle.    History of Present Illness: Patient is a 34 y.o. female who presents today for ultrasound evaluation of the above.  Ultrasound demonstrates the following findings Adnexa: no masses seen  Uterus: anteverted with endometrial stripe  10.1 mm Additional: an attempted SIS was performed. There was minimal distension. No apparent polyp noted, though suboptimal views obtained. I discussed this with the patient. She voiced understanding.   Past Medical History:  Diagnosis Date  . Complication of anesthesia    PT STATES ANESTHESIA ALWAYS ASKS HER IF SHE SMOKES WHEN COMING OUT OF ANESTHESIA-PT UNSURE IF SHE IS HAVING TROUBLE BREATHING OR COUGHING??  . Fluid retention   . GERD (gastroesophageal reflux disease)    H/O  . Sullivan Lone syndrome     Past Surgical History:  Procedure Laterality Date  . APPENDECTOMY    . CESAREAN SECTION     x2  . LAPAROSCOPIC OVARIAN CYSTECTOMY Right 03/27/2015   Procedure: LAPAROSCOPIC OVARIAN CYSTECTOMY;  Surgeon: Nadara Mustard, MD;  Location: [facility];  Service: Gynecology;  Laterality: Right;  . LAPAROSCOPIC UNILATERAL SALPINGECTOMY Right 03/27/2015   Procedure: LAPAROSCOPIC UNILATERAL SALPINGECTOMY;  Surgeon: Nadara Mustard, MD;  Location: [facility];  Service: Gynecology;  Laterality: Right;  . LAPAROSCOPY N/A 03/27/2015   Procedure: DIAGNOSTIC LAPAROSCOPY ;  Surgeon: Nadara Mustard, MD;  Location: [facility];  Service: Gynecology;  Laterality: N/A;  . SINUS EXPLORATION    . TUBAL LIGATION      Family History  Problem Relation Age of Onset  . Heart disease Father   . Heart disease Paternal Grandmother     Social History   Socioeconomic History  . Marital status: Married    Spouse name: Not on file  . Number of children: Not on file  . Years of education: Not on file  . Highest education level: Not on file    Occupational History  . Not on file  Tobacco Use  . Smoking status: Never Smoker  . Smokeless tobacco: Never Used  Vaping Use  . Vaping Use: Never used  Substance and Sexual Activity  . Alcohol use: Yes    Comment: OCC  . Drug use: No  . Sexual activity: Yes    Birth control/protection: Surgical    Comment: Tubal Ligation  Other Topics Concern  . Not on file  Social History Narrative  . Not on file   Social Determinants of Health   Financial Resource Strain:   . Difficulty of Paying Living Expenses:   Food Insecurity:   . Worried About Programme researcher, broadcasting/film/video in the Last Year:   . Barista in the Last Year:   Transportation Needs:   . Freight forwarder (Medical):   Marland Kitchen Lack of Transportation (Non-Medical):   Physical Activity:   . Days of Exercise per Week:   . Minutes of Exercise per Session:   Stress:   . Feeling of Stress :   Social Connections:   . Frequency of Communication with Friends and Family:   . Frequency of Social Gatherings with Friends and Family:   . Attends Religious Services:   . Active Member of Clubs or Organizations:   . Attends Banker Meetings:   Marland Kitchen Marital Status:   Intimate Partner Violence:   . Fear of Current or Ex-Partner:   . Emotionally Abused:   Marland Kitchen Physically Abused:   .  Sexually Abused:     Allergies  Allergen Reactions  . Tape Rash    SURGICAL TAPE FROM C-SECTION    Prior to Admission medications   Medication Sig Start Date End Date Taking? Authorizing Provider  hydrochlorothiazide (HYDRODIURIL) 25 MG tablet Take 25 mg by mouth daily.    [provider]    Physical Exam There were no vitals taken for this visit.   General: NAD HEENT: normocephalic, anicteric Pulmonary: No increased work of breathing Extremities: no edema, erythema, or tenderness Neurologic: Grossly intact, normal gait Psychiatric: mood appropriate, affect full  Imaging Results [place] Sonohysterogram  Result Date:  07/15/2019 Patient Name: Kristina Cross DOB: 1985-04-30 MRN: [id number] ULTRASOUND REPORT Location: [facility] Date of Service: 07/15/2019 Indications:Abnormal Uterine Bleeding Findings: The uterus is anteverted. Echo texture is homogenous without evidence of focal masses. The Endometrium measures 10.1 mm. An SIS procedure was performed today. The fluid did not distend the endometrial cavity. Bilateral ovaries appear within normal limits. Survey of the adnexa demonstrates no adnexal masses. There is no free fluid in the cul de sac. Impression: 1. The endometrium is somewhat thick. 2. Normal appearing ovaries. 3. An SIS was performed today. 4. The fluid did not distend the endometrial cavity. No evidence of uterine lesion (polyp). Deanna Artis, RT The ultrasound images and findings were reviewed by me and I agree with the above report. Thomasene Mohair, MD, [facility], [facility] 07/15/2019 3:56 PM       Assessment: 34 y.o. G2P1102  1. Menorrhagia with irregular cycle   2. Thickened endometrium      Plan: Problem List Items Addressed This Visit    None    Visit Diagnoses    Menorrhagia with irregular cycle       Thickened endometrium         She has had a normal pap. Her endometrial lining is a little thickened, but within normal limits. No definite evidence of polyp. Discussed management strategies: no treatment, medication, and surgery. She does not want surgery. Will attempt to control menses with lo loestrin and follow symptoms.    A total of 20 minutes were spent face-to-face with the patient as well as preparation, review, communication, and documentation during this encounter.    Thomasene Mohair, MD, [facility], [facility] 07/15/2019 5:50 PM

## 2019-08-26 ENCOUNTER — Telehealth: Payer: Self-pay

## 2019-08-26 NOTE — Telephone Encounter (Signed)
Spoke w/patient. She was started on lo dose birth control to help w/period control. She had 2 periods in July and started the birth control on 08/04/19. She started her period early on 8/11 & it lasted til 8/16. She states she pretty much has spotting everyday regardless and she's now cramping and feeling like she's about to start again. Advised when beginning or switching birth control, providers like patients to complete 3 full packs prior to making any adjustment, unless the symptoms are worse or unbearable. Symptoms should improve with each additional monthly pack. Patient will monitor and call back if needed.

## 2019-08-26 NOTE — Telephone Encounter (Signed)
Patient has questions about med that [facility] put her on ~1 month ago. Cb#[phone]

## 2021-04-13 ENCOUNTER — Ambulatory Visit (INDEPENDENT_AMBULATORY_CARE_PROVIDER_SITE_OTHER): Payer: No Typology Code available for payment source | Admitting: Family Medicine

## 2021-04-13 ENCOUNTER — Encounter: Payer: Self-pay | Admitting: Family Medicine

## 2021-04-13 VITALS — BP 100/62 | HR 74 | Ht 62.0 in | Wt 137.0 lb

## 2021-04-13 DIAGNOSIS — R0789 Other chest pain: Secondary | ICD-10-CM | POA: Diagnosis not present

## 2021-04-13 MED ORDER — DICLOFENAC SODIUM 75 MG PO TBEC: 75.0000 mg | DELAYED_RELEASE_TABLET | Freq: Two times a day (BID) | ORAL | 1 refills | Status: AC

## 2021-04-13 NOTE — Progress Notes (Signed)
? ?  GYNECOLOGY ANNUAL PREVENTATIVE CARE ENCOUNTER NOTE ? ?Subjective:  ? Kristina Cross is a 36 y.o. G63P1102 female here for a routine annual gynecologic exam.  Current complaints: occasionally has 2 periods per month.   Denies abnormal vaginal bleeding, discharge, pelvic pain, problems with intercourse or other gynecologic concerns.  ?  ?Gynecologic History ?Patient's last menstrual period was 03/30/2021 (approximate). ?Contraception: tubal ligation ?Last Pap: 2021. Results were: normal ?Last mammogram: @39  ? ?Health Maintenance Due  ?Topic Date Due  ? HIV Screening  Never done  ? Hepatitis C Screening  Never done  ? ? ?The following portions of the patient's history were reviewed and updated as appropriate: allergies, current medications, past family history, past medical history, past social history, past surgical history and problem list. ? ?Review of Systems ?Pertinent items are noted in HPI. ?  ?Objective:  ?BP 100/62 (Cuff Size: Normal)   Pulse 74   Ht 5\' 2"  (1.575 m)   Wt 137 lb (62.1 kg)   LMP 03/30/2021 (Approximate)   BMI 25.06 kg/m?  ?CONSTITUTIONAL: Well-developed, well-nourished female in no acute distress.  ?HENT:  Normocephalic, atraumatic, External right and left ear normal. Oropharynx is clear and moist ?EYES:  No scleral icterus.  ?NECK: Normal range of motion, supple, no masses.  Normal thyroid.  ?SKIN: Skin is warm and dry. No rash noted. Not diaphoretic. No erythema. No pallor. ?NEUROLOGIC: Alert and oriented to person, place, and time. Normal reflexes, muscle tone coordination. No cranial nerve deficit noted. ?PSYCHIATRIC: Normal mood and affect. Normal behavior. Normal judgment and thought content. ?CARDIOVASCULAR: Normal heart rate noted, regular rhythm. 2+ distal pulses. ?RESPIRATORY: Effort and breath sounds normal, no problems with respiration noted. ?BREASTS: Symmetric in size. No masses, skin changes, nipple drainage, or lymphadenopathy. Has < 1cm rubbery, mobile mass in the  right outer quadrant ?ABDOMEN: Soft,  no distention noted.  No tenderness, rebound or guarding.  ?PELVIC: deferred ?MUSCULOSKELETAL: Normal range of motion.  ? ? ?Assessment and Plan:  ?1) Annual gynecologic examination no pap indicated :   Routine preventative health maintenance measures emphasized. ? ?2) Contraception counseling: Had BTL. Discussed LNG IUD for bleeding and pain control ? ?1. Costochondral chest pain ?- diclofenac (VOLTAREN) 75 MG EC tablet; Take 1 tablet (75 mg total) by mouth 2 (two) times daily with a meal for 7 days.  Dispense: 14 tablet; Refill: 1 ? ?2. Gilbert's disease ?Keep in minds for medication rx ? ? ?Please refer to After Visit Summary for other counseling recommendations.  ? ?Return in about 1 year (around 04/14/2022) for Yearly wellness exam. ? ?Caren Macadam, MD, MPH, ABFM ?Attending Physician ?[facility] [place] ? ?
